# Patient Record
Sex: Male | Born: 2000 | Race: White | Hispanic: No | Marital: Single
Health system: Midwestern US, Academic
[De-identification: ages and names within clinical notes are randomized; demographics above are authoritative.]

---
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
## 2022-05-21 ENCOUNTER — Encounter: Admit: 2022-05-21 | Discharge: 2022-05-21 | Payer: BC Managed Care – PPO

## 2022-05-21 DIAGNOSIS — R319 Hematuria, unspecified: Secondary | ICD-10-CM

## 2022-05-21 LAB — URINALYSIS DIPSTICK REFLEX TO CULTURE

## 2022-05-21 LAB — COMPREHENSIVE METABOLIC PANEL
ALBUMIN: 4.8 g/dL (ref 3.5–5.0)
ALK PHOSPHATASE: 73 U/L (ref 25–110)
ALT: 23 U/L (ref 7–56)
ANION GAP: 11 K/UL (ref 3–12)
AST: 15 U/L (ref 7–40)
BLD UREA NITROGEN: 9 mg/dL — AB (ref 7–25)
CALCIUM: 9.4 mg/dL (ref 8.5–10.6)
CHLORIDE: 103 MMOL/L (ref 98–110)
CO2: 23 MMOL/L (ref 21–30)
CREATININE: 0.6 mg/dL — AB (ref 0.4–1.24)
EGFR: >60 mL/min (ref >60)
GLUCOSE,PANEL: 79 mg/dL (ref 70–100)
POTASSIUM: 3.6 MMOL/L (ref 3.5–5.1)
SODIUM: 137 MMOL/L (ref 137–147)
TOTAL BILIRUBIN: 0.6 mg/dL (ref 0.3–1.2)
TOTAL PROTEIN: 7.6 g/dL (ref 6.0–8.0)

## 2022-05-21 LAB — CBC AND DIFF
ABSOLUTE BASO COUNT: 0 K/UL (ref 0–0.20)
ABSOLUTE EOS COUNT: 0.1 K/UL (ref 0–0.45)
ABSOLUTE MONO COUNT: 0.6 K/UL (ref 0–0.80)
MDW (MONOCYTE DISTRIBUTION WIDTH): 17 (ref <20.7)
WBC COUNT: 9.4 K/UL (ref 4.5–11.0)

## 2022-05-21 LAB — URINALYSIS MICROSCOPIC REFLEX TO CULTURE

## 2022-05-22 ENCOUNTER — Emergency Department: Admit: 2022-05-22 | Discharge: 2022-05-21 | Payer: BC Managed Care – PPO

## 2022-05-22 NOTE — ED Notes
Pt arrived to ED 46 with c/o blood in urine. Pt reports blood in urine over last 3 days, intermittent, sometimes spots of blood and sometimes blood tinged. Pt denies hx of similar. Pt endorses pain in urethra with voiding, denies flank pain. Pt denies N/V/fever/chills. Pt is A&Ox4, breathing appears NL. Pt resting on cart, connected to monitor, call light within reach.

## 2022-05-22 NOTE — Unmapped
Thank you for allowing the physicians and staff of [facility] Emergency Department to participate in your healthcare.    You were seen in the ED for blood in your urine. A resident physician and attending physician performed a history and physical examination. Initial vital signs were taken and lab work was done. These showed no blood and no white blood cells inside the urine.  There were no concerning signs for UTI at this time and he may have self resolved.  Please continue to hydrate yourself adequately and clean the genital area appropriately.    Please follow up with your PCP within one week to ensure resolution of symptoms.    Please return to the ED if you have any new flank pains, fevers or worsening blood in your urine.    Please follow up with the designated physician as instructed.  If you do not have a primary care physician, you need to establish care with one.  Ask your physician to obtain your records and go over all results in detail.  Some of the results provided to you today may be preliminary results and significant changes will be provided to you as necessary.  However, there may be incidental findings (such as a lung nodule on a chest x-ray) that will require follow up in the near future by you and your primary care physician.      If you received any narcotic pain medications or sedatives while in the ED, you should NOT drive and you should not drive/operate machinery for 24 hours or while on those medications.    If your blood pressure was over 130/90, you should see your doctor to get your blood pressure rechecked.  Your doctor may start medications to control your blood pressure.      If your blood sugar was elevated, you should see your doctor to get your blood sugar rechecked.  Your doctor may start medications to control your blood sugar.      If you were diagnosed with seizures, you may NOT drive for the next 6 months.  You should not drive, operate any heavy machinery, bathe or shower while home alone or with the bathroom door locked, swim alone, climb up on a ladder, use firearms, or do anything that would put you or others in danger should you have another seizure during that activity.  Please schedule an appointment for follow-up with both your primary care physician and with the department of neurology.    If you have a wound, we have done our best to clean and care for the injury.  There may be retained foreign bodies that could not be seen, found, or removed.  Watch for signs of infection (redness, warmth, swelling, discharge, fever) and return to the ER, or follow-up with your regular physician, if any of these occur.  Sutures on the face should be removed in 5-7 days or as otherwise instructed.  Sutures/staples on other areas of the body should be removed in 10-14 days or as otherwise instructed.  Do not put antibiotic ointment on wound adhesive/glue.  You may safely shower and cleanse your repaired wounds with soap and water after 24 hours, but do not soak wounds or get them wet for prolonged periods of time (no soaking bath or swimming).    Smoking, vaping or chewing tobacco is bad for your health.  Do not start using these products.  If you use any form of tobacco, it is highly recommended that  you stop using these products.  You may need help to quit using tobacco products and you should ask any of your health care providers about resources available to assist in quitting.       You may return to the ER at any time and for any health care concern that you believe is in need of emergent/urgent/timely evaluation.

## 2023-01-29 ENCOUNTER — Emergency Department
Admit: 2023-01-29 | Discharge: 2023-01-30 | Disposition: A | Discharge status: Home / Self Care | Payer: Commercial Managed Care - Pharmacy Benefit Manager

## 2023-01-29 ENCOUNTER — Encounter: Admit: 2023-01-29 | Discharge: 2023-01-29 | Payer: Commercial Managed Care - Pharmacy Benefit Manager

## 2023-08-31 ENCOUNTER — Encounter: Admit: 2023-08-31 | Discharge: 2023-08-31 | Payer: PRIVATE HEALTH INSURANCE

## 2023-08-31 ENCOUNTER — Emergency Department
Admit: 2023-08-31 | Discharge: 2023-09-01 | Disposition: A | Discharge status: Home / Self Care | Payer: PRIVATE HEALTH INSURANCE

## 2023-08-31 DIAGNOSIS — G40909 Epilepsy, unspecified, not intractable, without status epilepticus: Principal | ICD-10-CM

## 2023-09-27 ENCOUNTER — Encounter: Admit: 2023-09-27 | Discharge: 2023-09-27 | Payer: PRIVATE HEALTH INSURANCE

## 2023-09-27 NOTE — Telephone Encounter
 Meds and allergies in O2  My chart link sent  Referred by Dr. Langley Bittel-seizures  EEG- 2020  MRI-09-22-18--Imaging  Verified appointment and directions

## 2023-10-09 ENCOUNTER — Encounter: Admit: 2023-10-09 | Discharge: 2023-10-09 | Payer: PRIVATE HEALTH INSURANCE

## 2023-10-10 ENCOUNTER — Encounter: Admit: 2023-10-10 | Discharge: 2023-10-10 | Payer: PRIVATE HEALTH INSURANCE

## 2023-10-10 ENCOUNTER — Ambulatory Visit: Admit: 2023-10-10 | Discharge: 2023-10-11 | Payer: PRIVATE HEALTH INSURANCE

## 2023-10-10 MED ORDER — ZONISAMIDE 100 MG PO CAP
200 mg | ORAL_CAPSULE | Freq: Every evening | ORAL | 5 refills | 90.00000 days | Status: AC
Start: 2023-10-10 — End: ?

## 2023-10-10 NOTE — Progress Notes
 [place]   Epilepsy Division  New Patient Visit  -----------------------------------------------------------------------------------------------------------------------------------------        CLINICAL SUMMARY   Juan Morrison is a 23 y.o. male with a PMH generalized epilepsy who presented to the ED on 08/31/23 after having a seizure. He has not tolerated Keppra  in the past due to side effects and does not wish to resume this. Due to his potential for resuming medications for mood disorders, we discussed zonisamide  vs. Lamotrigine, and patient would prefer zonisamide  at this time. We also discussed that he may benefit from seeing a provider again for his psychiatry care and he agreed.     EPILEPSY CLASSIFICATION  Epileptogenic zone:   Generalized  Seizure semiology: GTC, Absence vs. Dyscognitive, myoclonic   Etiology:  Unknown  Comorbidities:    Anxiety/depression (that has responded well to bupropion in the past)        MANAGEMENT AND PLAN  During the visit I discussed my impression, recommended diagnostic studies, prognosis, risks and benefits of management, instructions for management, and importance of compliance.  After a discussion, the patient agrees with the plan.  Total time for clinic visit was 60 minutes.    RECOMMENDATIONS  1. Start Zonisamide , uptitration up to 200 mg (over 3 weeks, plan provided to patient)   2. Referral to Dr. Erika for management of psychiatric medication in patient with comorbid epilepsy     The patient is instructed not to drive unless 6 months free of alteration of consciousness, not to work in close proximity of machines with moving parts, not to swim unsupervised or to work at high places. The patient is to shower (without accumulation of water) instead of taking a bath if unsupervised.    FOLLOWUP PLAN  I plan to see the patient back for follow-up in approximately 4 months.  The patient is instructed to contact us  if there is an exacerbation of the seizures or any side effects from the antiseizure medications.        Cherylle Tajuana Kniskern, MD  Epilepsy Fellow        Staffed with Dr. Royetta    ===========================================================================  Referring physician:   Bittel, Brennen L, DO  [phone] [facility] [address]  [place],  [place] [address]           Juan Morrison is a 23 y.o. male with a PMH generalized epilepsy who presented to the ED on 08/31/23 after having a seizure. Patient was off of medication since 2022 due to discussing weaning with his prior doctor and had episode lasting 5 minutes, so came to ED. He was discharged on Keppra , but he did not resume this on discharge as Keppra  has made him uneasy in the past. He has remained seizure free since discharge.     In Late 2022 , he was stopped on Keppra  as he didn't have insurance and wanted to get off and taper the medication. After discontinuation, he was free for GTC for 7 months. Then in the last 3 years has had a total of 3 GTC.     Reports he gets a feeling before his small seizures where he  feels slower before his seizure comes on, weird out of it feeling.  Sometimes after seizure has weird taste in mouth. No feelings in his stomach. Feels dejavu with seizures.Does have anxiety sometimes before seizures.       SEIZURE DESCRIPTION  Seizure onset:  August 2020 was diagnosed after having GTC, but was  likely having seizures before age 40.  There were episodes of finding himself on the floor or had injuries he could not explain, that was happening every couple months. Was diagnosed at [facility] and seen with an NP. He was increased on Keppra  to 1250 mg BID, and patient reports that though his GTC were controlled, his smaller seizures never went away.     Seizure description:   1) Could not interact with environment and had memory lapse , not noticed to be abnormally blinking or drooling or moving during episode   2) Generalized BTC. Has had urinary incontinence and vomiting prior to or after seizure. He notices these occur in his sleep or when he awakens.   3) Lack of coordination upon awakening, he feels he loses strength and feels jerky     Seizure duration: 1 minutes-2 minutes    Seizure frequency:   GTC once/year   Smaller seizures are once-twice/month    Precipitating factors: sleep deprivation    Previous ASMs:  Keppra  with severe mood side effects     Other history: Anxiety and depression (not on medication currently but reports Buproprion was the only medication that would provide relief)        Epilepsy risk factors:   1. Head Trauma: no   2. CNS Infections: no  3. Family History of Seizures: no   4. Developmental Delay: possible concerns for ADHD and autism   5. Febrile Seizures: no  6. CNS Tumors: no  7. CNS Vascular Disease: no  8. Significant Medical History: see below  9. Birth and early development: normal      PREVIOUS TESTS:    Physiological Testing  Routine EEG 09/21/2018:   The posterior dominant rhythm is 9 Hz.  The EEG is further described as containing frequent bifrontal predominant generalized polyspike wave complexes at times occurring in runs with the longest occurring for 3sec at 3.5Hz .     Drowsiness results in attenuation of the background and increased theta activity. Stage II of sleep is seen, as evidenced by presence of sleep spindles.      Basic Structural Imaging- Personally reviewed and agree with official report unless otherwise stated      MRI brain:   09/2018 - Normal MRI of the brain without findings to explain seizures. 3T MRI was unavailable at the time of imaging (due to technical difficulties).     Labs  Labs reviewed       ===============================================        OTHER HISTORY  Past Medical History:  As in chart, reviewed     Past Surgical History:  As in chart, reviewed     Family History:  As in chart, reviewed, no PMH of epilepsy in family.   Has a brother who was diagnosed with a brain tumor diagnosed in his 30s, meningioma. He had memory, speech and vision issues.        Social History:  Education level - high school  Work - about to start a job   Living status - lives with people   Alcohol use- few times/week, 3-4 beer/liquors   Tobacco use - uses vaping daily  Drug use - recreational marijuana use, occasional psychedelic use    Allergies: reviewed in chart    Medications:  Current Medications[1] .                PHYSICAL EXAMINATION:  Vitals: There were no vitals filed for this visit.  General:  No scleral icterus  Moist mucus membranes  Peripheral pulses Intact  Aerating well  Abdomen soft  No peripheral edema  Skin dry    Neurologic Exam:  Mental status:  ? Alert and oriented x 3  ? Recent and remote memory intact  ? Concentration and attention intact  ? Fund of knowledge intact; mood and affect normal  ? Language including naming, repetition, comprehension is normal and spontaneous speech is normal    Cranial nerves:  ? Visual acuity is normal and visual fields to confrontation are normal  ? Ocular motility full and pupils are equal round and reactive to light  ? Facial sensation intact in V1-V3   ? Face symmetric and strength normal  ? Hearing intact bilaterally to finger rub  ? Palate elevation symmetric, tongue protrusion midline  ? Shoulder shrug is symmetric    Motor:  ? Muscle bulk and tone are normal in all extremities  ? Muscle strength is normal to confrontation in all extremities    Reflexes:  ? 2+ and symmetric in all four extremities  - plantar reflex - flexor bilaterally     Sensory:  ? Sensory exam is normal to light touch and temperature    Coordination:  ? Finger to nose testing without dysmetria    Gait:  ? Casual gait and station are normal             [1]   Current Outpatient Medications:     chlorhexidine  gluconate (PERIDEX ) 0.12 % solution, Swish and Spit 15 mL by mouth as directed twice daily., Disp: , Rfl:     clonazePAM  (KLONOPIN ) 2 mg rapid dissolve tablet, Dissolve one tablet by mouth as Needed for Other... (for seizure lasting longer than 5 minutes)., Disp: 3 tablet, Rfl: 0    levETIRAcetam  (KEPPRA ) 750 mg tablet, Take one tablet by mouth twice daily., Disp: 180 tablet, Rfl: 0    omeprazole DR (PRILOSEC) 20 mg capsule, Take one capsule by mouth daily before breakfast., Disp: 30 capsule, Rfl: 0    ondansetron (ZOFRAN ODT) 4 mg rapid dissolve tablet, Dissolve one tablet by mouth every 8 hours as needed. Place on tongue to dissolve., Disp: 12 tablet, Rfl: 0

## 2023-10-10 NOTE — Patient Instructions
 Start zonisamide  100 mg caps as follows:     Week 1: one capsule every OTHER night  Week 2: one capsule every night  Week 3: two capsules every night  Week 4: call us  with update; sooner if any issues    Monitor for side effects

## 2023-10-11 DIAGNOSIS — G40909 Epilepsy, unspecified, not intractable, without status epilepticus: Principal | ICD-10-CM

## 2023-10-11 DIAGNOSIS — Z151 Genetic susceptibility to epilepsy and neurodevelopmental disorders: Secondary | ICD-10-CM

## 2023-10-11 DIAGNOSIS — F419 Anxiety disorder, unspecified: Secondary | ICD-10-CM

## 2023-10-11 DIAGNOSIS — G40409 Other generalized epilepsy and epileptic syndromes, not intractable, without status epilepticus: Secondary | ICD-10-CM
